# Patient Record
Sex: Female | Born: 2000 | Race: Black or African American | Hispanic: No | Marital: Single
Health system: Southern US, Community
[De-identification: ages and names within clinical notes are randomized; demographics above are authoritative.]

---
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
## 2018-05-30 ENCOUNTER — Other Ambulatory Visit: Payer: Self-pay

## 2018-05-30 ENCOUNTER — Emergency Department (HOSPITAL_COMMUNITY)
Admission: EM | Admit: 2018-05-30 | Discharge: 2018-05-30 | Disposition: A | Discharge status: Home / Self Care | Payer: Self-pay | Attending: Emergency Medicine | Admitting: Emergency Medicine

## 2018-05-30 DIAGNOSIS — Z3202 Encounter for pregnancy test, result negative: Secondary | ICD-10-CM | POA: Insufficient documentation

## 2018-05-30 LAB — I-STAT BETA HCG BLOOD, ED (MC, WL, AP ONLY): I-stat hCG, quantitative: <5 m[IU]/mL (ref <5)

## 2018-05-30 NOTE — ED Provider Notes (Signed)
[facility] EMERGENCY DEPARTMENT Provider Note   CSN: [id number] Arrival date & time: 05/30/18  1847    History   Chief Complaint No chief complaint on file.   HPI Elizabeth Rowe is a 18 y.o. female who presents to ED requesting a pregnancy test.  She is 11 days late on her normal menstrual cycle.  She denies any abdominal pain.  She did recently come off of her Nexplanon birth control.  Denies any other complaints.  Her home pregnancy test was negative but she "just wants to make sure."     HPI  No past medical history on file.  There are no active problems to display for this patient.    OB History   No obstetric history on file.      Home Medications    Prior to Admission medications   Not on File    Family History No family history on file.  Social History Social History   Tobacco Use  . Smoking status: Not on file  Substance Use Topics  . Alcohol use: Not on file  . Drug use: Not on file     Allergies   Patient has no allergy information on record.   Review of Systems Review of Systems  Gastrointestinal: Negative for abdominal pain.  Genitourinary: Negative for dysuria.  Skin: Negative for rash.     Physical Exam Updated Vital Signs BP 108/61 (BP Location: Right Arm)   Pulse 70   Temp 98.9 F (37.2 C) (Oral)   Resp 18   SpO2 100%   Physical Exam Vitals signs and nursing note reviewed.  Constitutional:      General: She is not in acute distress.    Appearance: She is well-developed. She is not diaphoretic.  HENT:     Head: Normocephalic and atraumatic.  Eyes:     General: No scleral icterus.    Conjunctiva/sclera: Conjunctivae normal.  Neck:     Musculoskeletal: Normal range of motion.  Pulmonary:     Effort: Pulmonary effort is normal. No respiratory distress.  Skin:    Findings: No rash.  Neurological:     Mental Status: She is alert.      ED Treatments / Results  Labs (all labs ordered are listed,  but only abnormal results are displayed) Labs Reviewed  I-STAT BETA HCG BLOOD, ED ([facility], [facility], [facility] ONLY)    EKG None  Radiology No results found.  Procedures Procedures (including critical care time)  Medications Ordered in ED Medications - No data to display   Initial Impression / Assessment and Plan / ED Course  I have reviewed the triage vital signs and the nursing notes.  Pertinent labs & imaging results that were available during my care of the patient were reviewed by me and considered in my medical decision making (see chart for details).        18 year old female presents to ED requesting a pregnancy test.  Her home pregnancy test was negative.  She is 11 days late on her period.  She denies any other complaints.  She recently came off of her Nexplanon birth control.  Pregnancy test here is negative.  We will have her follow-up with her OB/GYN.  Patient is hemodynamically stable, in NAD, and able to ambulate in the ED. Evaluation does not show pathology that would require ongoing emergent intervention or inpatient treatment. I explained the diagnosis to the patient. Pain has been managed and has no complaints prior to discharge. Patient is  comfortable with above plan and is stable for discharge at this time. All questions were answered prior to disposition. Strict return precautions for returning to the ED were discussed. Encouraged follow up with PCP.    Portions of this note were generated with Scientist, clinical (histocompatibility and immunogenetics). Dictation errors may occur despite best attempts at proofreading.   Final Clinical Impressions(s) / ED Diagnoses   Final diagnoses:  Negative pregnancy test    ED Discharge Orders    None       Dietrich Pates, PA-C 05/30/18 2045    Maia Plan, MD 05/31/18 1123

## 2018-05-30 NOTE — Discharge Instructions (Addendum)
Follow-up with your OBGYN

## 2018-05-30 NOTE — ED Notes (Signed)
Patient is A&Ox4 at this time.  Patient in no signs of distress.  Please see providers note for complete history and physical exam.  

## 2018-05-30 NOTE — ED Triage Notes (Signed)
Pt here from home wanting a pregnancy test , pt had a negative test at home but want to be sure

## 2018-05-30 NOTE — ED Notes (Signed)
Patient verbalizes understanding of discharge instructions. Opportunity for questioning and answers were provided. Armband removed by staff, pt discharged from ED.
